# Patient Record
Sex: Male | Born: 1955 | Race: White | Hispanic: No | Marital: Single | Smoking: Never smoker
Health system: Southern US, Community
[De-identification: ages and names within clinical notes are randomized; demographics above are authoritative.]

## PROBLEM LIST (undated history)

## (undated) DIAGNOSIS — E079 Disorder of thyroid, unspecified: Secondary | ICD-10-CM

## (undated) DIAGNOSIS — K219 Gastro-esophageal reflux disease without esophagitis: Secondary | ICD-10-CM

## (undated) DIAGNOSIS — IMO0001 Reserved for inherently not codable concepts without codable children: Secondary | ICD-10-CM

## (undated) DIAGNOSIS — B009 Herpesviral infection, unspecified: Secondary | ICD-10-CM

## (undated) HISTORY — PX: RECTAL SURGERY: SHX760

---

## 2001-04-10 ENCOUNTER — Emergency Department (HOSPITAL_COMMUNITY): Admission: EM | Admit: 2001-04-10 | Discharge: 2001-04-10 | Payer: Self-pay | Admitting: Emergency Medicine

## 2001-04-10 ENCOUNTER — Encounter: Payer: Self-pay | Admitting: Emergency Medicine

## 2001-05-12 ENCOUNTER — Ambulatory Visit (HOSPITAL_COMMUNITY): Admission: RE | Admit: 2001-05-12 | Discharge: 2001-05-12 | Payer: Self-pay | Admitting: *Deleted

## 2005-05-01 ENCOUNTER — Ambulatory Visit (HOSPITAL_COMMUNITY): Admission: RE | Admit: 2005-05-01 | Discharge: 2005-05-01 | Payer: Self-pay | Admitting: *Deleted

## 2005-05-01 ENCOUNTER — Encounter (INDEPENDENT_AMBULATORY_CARE_PROVIDER_SITE_OTHER): Payer: Self-pay | Admitting: Specialist

## 2006-05-08 ENCOUNTER — Encounter: Admission: RE | Admit: 2006-05-08 | Discharge: 2006-05-08 | Payer: Self-pay | Admitting: Orthopedic Surgery

## 2008-07-04 ENCOUNTER — Encounter (INDEPENDENT_AMBULATORY_CARE_PROVIDER_SITE_OTHER): Payer: Self-pay | Admitting: *Deleted

## 2008-07-04 ENCOUNTER — Ambulatory Visit (HOSPITAL_COMMUNITY): Admission: RE | Admit: 2008-07-04 | Discharge: 2008-07-04 | Payer: Self-pay | Admitting: *Deleted

## 2008-07-14 ENCOUNTER — Encounter: Admission: RE | Admit: 2008-07-14 | Discharge: 2008-07-14 | Payer: Self-pay | Admitting: Endocrinology

## 2008-08-24 ENCOUNTER — Encounter: Admission: RE | Admit: 2008-08-24 | Discharge: 2008-08-24 | Payer: Self-pay | Admitting: Surgery

## 2009-11-01 ENCOUNTER — Encounter: Admission: RE | Admit: 2009-11-01 | Discharge: 2009-11-01 | Payer: Self-pay | Admitting: Cardiovascular Disease

## 2009-11-02 ENCOUNTER — Ambulatory Visit (HOSPITAL_COMMUNITY): Admission: RE | Admit: 2009-11-02 | Discharge: 2009-11-02 | Payer: Self-pay | Admitting: Cardiovascular Disease

## 2009-12-26 ENCOUNTER — Encounter: Admission: RE | Admit: 2009-12-26 | Discharge: 2009-12-26 | Payer: Self-pay | Admitting: Internal Medicine

## 2010-09-13 ENCOUNTER — Other Ambulatory Visit: Payer: Self-pay | Admitting: Internal Medicine

## 2010-09-13 DIAGNOSIS — E041 Nontoxic single thyroid nodule: Secondary | ICD-10-CM

## 2010-11-12 NOTE — Op Note (Signed)
NAME:  Lonnie Waters, Lonnie Waters NO.:  [id number]   MEDICAL RECORD NO.:  [id number]          PATIENT TYPE:  AMB   LOCATION:  ENDO                         FACILITY:  [facility]   PHYSICIAN:  Georgiana Spinner, M.D.    DATE OF BIRTH:  04-May-1956   DATE OF PROCEDURE:  07/04/2008  DATE OF DISCHARGE:                               OPERATIVE REPORT   PROCEDURE:  Colonoscopy.   INDICATIONS:  Rectal bleeding.   ANESTHESIA:  Fentanyl 150 mcg, Versed 15 mg.   PROCEDURE:  With the patient mildly sedated in the left lateral  decubitus position, a rectal examination was performed.  Perineal exam  showed external skin tags.  Rectal exam was unremarkable.  Subsequently,  the Pentax videoscopic colonoscope was inserted in the rectum, passed  under direct vision with pressure applied to reach the cecum identified  by ileocecal valve and appendiceal orifice, both which were  photographed.  From this point the colonoscope was slowly withdrawn  taking circumferential views of colonic mucosa stopping in the  transverse colon where a polyp was seen, photographed and removed using  snare cautery technique setting of 20/150 blended current.  Tissue was  then retrieved by suctioning through the endoscope into a tissue trap.  The endoscope was then further withdrawn taking circumferential views of  the remaining colonic mucosa stopping in the rectum which appeared  normal on direct and showed internal hemorrhoids on retroflexed view.  I  could not see a stricture at this point.  The endoscope was straightened  and withdrawn.  The patient's vital signs, pulse oximeter remained  stable.  The patient tolerated procedure well without apparent  complications.   FINDINGS:  Internal hemorrhoids with no evidence of fissure or  stricture.  External skin tags were noted.  Additionally a polyp was  noted in the transverse colon.   PLAN:  Referral to surgery for treatment of continued anal discomfort  and await  biopsy report.  The patient will call me for results and  follow up with me as an outpatient.           ______________________________  Georgiana Spinner, M.D.     GMO/MEDQ  D:  07/04/2008  T:  07/04/2008  Job:  [id number]

## 2010-11-15 NOTE — Op Note (Signed)
NAME:  Lonnie Waters, Lonnie Waters NO.:  [id number]   MEDICAL RECORD NO.:  [id number]          PATIENT TYPE:  AMB   LOCATION:  ENDO                         FACILITY:  [facility]   PHYSICIAN:  Georgiana Spinner, M.D.    DATE OF BIRTH:  [birth year]   DATE OF PROCEDURE:  DATE OF DISCHARGE:                                 OPERATIVE REPORT   PROCEDURE:  Colonoscopy.   INDICATIONS:  Rectal bleeding.   ANESTHESIA:  Demerol 25, Verse 2.5 mg.   PROCEDURE:  With the patient mildly sedated in the left lateral decubitus  position, a rectal examination was performed which was unremarkable.  Subsequently the Olympus videoscopic colonoscope was inserted in the rectum  and passed under direct vision to the cecum, identified by ileocecal valve  and base of cecum, both of which were photographed.  From this point, the  colonoscope was slowly withdrawn taking circumferential views of the colonic  mucosa, stopping to photograph diverticulosis of the sigmoid colon until we  reached the rectum which appeared normal on direct and showed hemorrhoids on  retroflexed view.  The endoscope was straightened and withdrawn through the  anal canal. The patient's vital signs, pulse oximeter remained stable. The  patient tolerated procedure well without apparent complications.   FINDINGS:  1.  Diverticulosis of sigmoid colon.  2.  Internal hemorrhoids.   PLAN:  See endoscopy note further details.           ______________________________  Georgiana Spinner, M.D.     GMO/MEDQ  D:  05/01/2005  T:  05/01/2005  Job:  [id number]

## 2010-11-15 NOTE — Op Note (Signed)
NAME:  Lonnie Waters, Lonnie Waters NO.:  [id number]   MEDICAL RECORD NO.:  [id number]          PATIENT TYPE:  AMB   LOCATION:  ENDO                         FACILITY:  [facility]   PHYSICIAN:  Georgiana Spinner, M.D.    DATE OF BIRTH:  02-17-1956   DATE OF PROCEDURE:  05/01/2005  DATE OF DISCHARGE:                                 OPERATIVE REPORT   PROCEDURE:  Upper endoscopy with biopsy.   INDICATIONS:  GERD.   ANESTHESIA:  Demerol 75, Versed 7.5 milligrams.   PROCEDURE:  With the patient mildly sedated in the left lateral decubitus  position the Olympus videoscopic endoscope was inserted in the mouth and  passed under direct vision through the esophagus which appeared normal.  There was no evidence of Barrett's esophagus seen. We entered into the  stomach.  The fundus, body, antrum, duodenal bulb, second portion duodenum  were visualized.  From this point the endoscope was slowly withdrawn taking  circumferential views of duodenal mucosa until the endoscope had been pulled  back in the stomach, placed in retroflexion to view the stomach from below.  The endoscope was then straightened and withdrawn taking circumferential  views of the remaining gastric and esophageal mucosa stopping to biopsy the  stomach in the areas of diffuse erythema.  The patient's vital signs, pulse  oximeter remained stable. The patient tolerated procedure well without  apparent complications.   FINDINGS:  Diffuse gastritis and moderately severe duodenitis.   PLAN:  Await biopsy report. The patient will call me for results and follow-  up with me as an outpatient but given the marked erythema of the stomach and  the duodenum, I think PPI therapy would certainly be reasonable at the least  and/or treatment for H. Pylori if positive.  Proceed to colonoscopy as  planned.           ______________________________  Georgiana Spinner, M.D.     GMO/MEDQ  D:  05/01/2005  T:  05/01/2005  Job:  [id number]

## 2010-11-15 NOTE — Procedures (Signed)
[facility]  Patient:    Lonnie Waters, Lonnie Waters Visit Number: [id number] MRN: [id number]          Service Type: END Location: ENDO Attending Physician:  Sabino Gasser Dictated by:   Sabino Gasser, M.D. Admit Date:  05/12/2001                             Procedure Report  PROCEDURE:  Colonoscopy.  ENDOSCOPIST:  Sabino Gasser, M.D.  INDICATIONS:  Colon cancer screening, change in bowel habits, heme-positive.  ANESTHESIA:  Demerol 60, Versed 8 mg.  DESCRIPTION OF PROCEDURE:  With the patient mildly sedated in the left lateral decubitus position, rectal examination was performed which revealed trace positive material.  Subsequently the Olympus videoscopic colonoscope was inserted in the rectum and passed under direction vision into the cecum, identified by ileocecal valve and appendiceal orifice, both of which were photographed from this point.  The colonoscope was slowly withdrawn, taking circumferential views of the entire colonic mucosa, stopping in the rectum which appeared normal on direct and retroflexed view and also with normal anal canal pull through.  The endoscope was withdrawn.  The patients vital signs and pulse oximetry remained stable.  The patient tolerated procedure well without apparent complications.  FINDINGS:  Essentially negative colonoscopic examination.  PLAN:  Followup with me as needed. Dictated by:   Sabino Gasser, M.D. Attending Physician:  Sabino Gasser DD:  05/12/01 TD:  05/12/01 Job: [id number]

## 2011-02-03 ENCOUNTER — Ambulatory Visit
Admission: RE | Admit: 2011-02-03 | Discharge: 2011-02-03 | Disposition: A | Discharge status: Home / Self Care | Payer: BC Managed Care – PPO | Source: Ambulatory Visit | Attending: Internal Medicine | Admitting: Internal Medicine

## 2011-02-03 DIAGNOSIS — E041 Nontoxic single thyroid nodule: Secondary | ICD-10-CM

## 2011-05-10 ENCOUNTER — Emergency Department (HOSPITAL_BASED_OUTPATIENT_CLINIC_OR_DEPARTMENT_OTHER)
Admission: EM | Admit: 2011-05-10 | Discharge: 2011-05-11 | Disposition: A | Discharge status: Home / Self Care | Payer: BC Managed Care – PPO | Attending: Emergency Medicine | Admitting: Emergency Medicine

## 2011-05-10 ENCOUNTER — Encounter: Payer: Self-pay | Admitting: *Deleted

## 2011-05-10 DIAGNOSIS — R109 Unspecified abdominal pain: Secondary | ICD-10-CM | POA: Insufficient documentation

## 2011-05-10 DIAGNOSIS — E079 Disorder of thyroid, unspecified: Secondary | ICD-10-CM | POA: Insufficient documentation

## 2011-05-10 DIAGNOSIS — K219 Gastro-esophageal reflux disease without esophagitis: Secondary | ICD-10-CM | POA: Insufficient documentation

## 2011-05-10 DIAGNOSIS — Z79899 Other long term (current) drug therapy: Secondary | ICD-10-CM | POA: Insufficient documentation

## 2011-05-10 HISTORY — DX: Gastro-esophageal reflux disease without esophagitis: K21.9

## 2011-05-10 HISTORY — DX: Reserved for inherently not codable concepts without codable children: IMO0001

## 2011-05-10 HISTORY — DX: Disorder of thyroid, unspecified: E07.9

## 2011-05-10 HISTORY — DX: Herpesviral infection, unspecified: B00.9

## 2011-05-10 LAB — URINALYSIS, ROUTINE W REFLEX MICROSCOPIC
Ketones, ur: 40 mg/dL — AB
Leukocytes, UA: NEGATIVE
Nitrite: NEGATIVE
Protein, ur: NEGATIVE mg/dL
Urobilinogen, UA: 1 mg/dL (ref 0.0–1.0)

## 2011-05-10 NOTE — ED Provider Notes (Signed)
Scribed for Lonnie Seamen, MD, the patient was seen in room MH10/MH10 . This chart was scribed by Lonnie Waters.   CSN: [id number] Arrival date & time: 05/10/2011 10:56 PM   First MD Initiated Contact with Patient 05/10/11 2324      Chief Complaint  Patient presents with  . Abdominal Pain    (Consider location/radiation/quality/duration/timing/severity/associated sxs/prior treatment) Patient is a 55 y.o. male presenting with abdominal pain. The history is provided by the patient. No language interpreter was used.  Abdominal Pain The primary symptoms of the illness include abdominal pain and fever. The primary symptoms of the illness do not include nausea, vomiting or diarrhea. The current episode started more than 2 days ago. The onset of the illness was sudden. The problem has not changed since onset. The abdominal pain is located in the epigastric region. The abdominal pain does not radiate. The severity of the abdominal pain is 10/10. The abdominal pain is relieved by movement. The abdominal pain is exacerbated by eating.  Additional symptoms associated with the illness include constipation.   Pt seen at 11:25 PM Lonnie Waters is a 55 y.o. male who presents to the Emergency Department complaining of intermittent epigastric abdominal pain for 3 days. Episodes of pain last for hours. Pain during each episode is described as constant and severe. Pt c/o associated severe constipation. Denies n/v/d. Pt treated pain today with prescription pain (left over from previous surgery) with improvement  Pt saw PCP Dr. Norma Waters yesterday. Pt had blood work done and told he either had ulcer or a rxn to new herpes medication. Pt dx with genital herpes this week and was put on acyclovir. Pt has active outbreak and reports low-grade fever all week. Pt quit taking acyclovir yesterday .     Past Medical History  Diagnosis Date  . Reflux   . Herpes   . Thyroid disease     Past Surgical History  Procedure  Date  . Rectal surgery     History reviewed. No pertinent family history.  History  Substance Use Topics  . Smoking status: Never Smoker   . Smokeless tobacco: Never Used  . Alcohol Use: Yes     4x weekly     Review of Systems  Constitutional: Positive for fever.  Gastrointestinal: Positive for abdominal pain and constipation. Negative for nausea, vomiting and diarrhea.  All other systems reviewed and are negative.   Allergies  Review of patient's allergies indicates no known allergies.  Home Medications   Current Outpatient Rx  Name Route Sig Dispense Refill  . ACYCLOVIR 400 MG PO TABS Oral Take 400 mg by mouth 3 (three) times daily.      . ARMODAFINIL 150 MG PO TABS Oral Take 150 mg by mouth 3 (three) times a week. Take on Tuesday, Friday and Sunday     . B COMPLEX PO TABS Oral Take 1 tablet by mouth daily.      Marland Kitchen ESOMEPRAZOLE MAGNESIUM 40 MG PO CPDR Oral Take 40 mg by mouth 2 (two) times daily.     . OMEGA-3 FATTY ACIDS 1000 MG PO CAPS Oral Take 1 g by mouth daily.      Marland Kitchen HYDROCODONE-ACETAMINOPHEN 10-325 MG PO TABS Oral Take 2 tablets by mouth once.      Marland Kitchen LEVOTHYROXINE SODIUM 100 MCG PO TABS Oral Take 100 mcg by mouth daily.      Marland Kitchen ONE-DAILY MULTI VITAMINS PO TABS Oral Take 1 tablet by mouth daily.      Marland Kitchen  VITAMIN C 500 MG PO TABS Oral Take 500 mg by mouth daily.      Marland Kitchen ZOLPIDEM TARTRATE 10 MG PO TABS Oral Take 5-10 mg by mouth at bedtime as needed. For sleep        BP 140/80  Pulse 62  Temp(Src) 98.1 F (36.7 C) (Oral)  Resp 18  Ht 5\' 9"  (1.753 m)  Wt 214 lb (97.07 kg)  BMI 31.60 kg/m2  SpO2 98%  Physical Exam  Nursing note and vitals reviewed. Constitutional: He is oriented to person, place, and time. He appears well-developed and well-nourished.  HENT:  Head: Normocephalic and atraumatic.  Eyes: Conjunctivae and EOM are normal.  Neck: Normal range of motion.  Cardiovascular: Normal rate, regular rhythm, normal heart sounds and intact distal pulses.     Pulmonary/Chest: Effort normal and breath sounds normal. No respiratory distress.  Abdominal: Soft. He exhibits no distension. There is no tenderness.       No gallstones seen on bedside US  Musculoskeletal: Normal range of motion.  Neurological: He is alert and oriented to person, place, and time.  Skin: Skin is warm and dry.  Psychiatric: He has a normal mood and affect.    ED Course  Procedures (including critical care time) OTHER DATA REVIEWED: Nursing notes, vital signs, and past medical records reviewed.   DIAGNOSTIC STUDIES: Oxygen Saturation is 98% on room air, normal by my interpretation.     11:32 PM EDP at PT bedside. Bedside US.  No gallstones noted.      MDM  1:06 AM Patient now complaining of pain and point tenderness in his right lower posterior rib region. There is point tenderness on exam. He is having difficulty distinguishing this from the abdominal pain he has been having.  I personally performed the services described in this documentation, which was scribed in my presence.  The recorded information has been reviewed and considered.  Nursing notes and vitals signs, including pulse oximetry, reviewed.  Summary of this visit's results, reviewed by myself:  Labs:  Results for orders placed during the hospital encounter of 05/10/11  URINALYSIS, ROUTINE W REFLEX MICROSCOPIC      Component Value Range   Color, Urine AMBER (*) YELLOW    Appearance CLEAR  CLEAR    Specific Gravity, Urine 1.028  1.005 - 1.030    pH 5.5  5.0 - 8.0    Glucose, UA NEGATIVE  NEGATIVE (mg/dL)   Hgb urine dipstick NEGATIVE  NEGATIVE    Bilirubin Urine NEGATIVE  NEGATIVE    Ketones, ur 40 (*) NEGATIVE (mg/dL)   Protein, ur NEGATIVE  NEGATIVE (mg/dL)   Urobilinogen, UA 1.0  0.0 - 1.0 (mg/dL)   Nitrite NEGATIVE  NEGATIVE    Leukocytes, UA NEGATIVE  NEGATIVE   CBC      Component Value Range   WBC 6.3  4.0 - 10.5 (K/uL)   RBC 4.65  4.22 - 5.81 (MIL/uL)   Hemoglobin 14.9  13.0 -  17.0 (g/dL)   HCT 04.5  40.9 - 81.1 (%)   MCV 90.5  78.0 - 100.0 (fL)   MCH 32.0  26.0 - 34.0 (pg)   MCHC 35.4  30.0 - 36.0 (g/dL)   RDW 91.4  78.2 - 95.6 (%)   Platelets 141 (*) 150 - 400 (K/uL)  DIFFERENTIAL      Component Value Range   Neutrophils Relative PENDING  43 - 77 (%)   Neutro Abs PENDING  1.7 - 7.7 (K/uL)   Band  Neutrophils PENDING  0 - 10 (%)   Lymphocytes Relative PENDING  12 - 46 (%)   Lymphs Abs PENDING  0.7 - 4.0 (K/uL)   Monocytes Relative PENDING  3 - 12 (%)   Monocytes Absolute PENDING  0.1 - 1.0 (K/uL)   Eosinophils Relative PENDING  0 - 5 (%)   Eosinophils Absolute PENDING  0.0 - 0.7 (K/uL)   Basophils Relative PENDING  0 - 1 (%)   Basophils Absolute PENDING  0.0 - 0.1 (K/uL)   WBC Morphology PENDING     RBC Morphology PENDING     Smear Review PENDING     nRBC PENDING  0 (/100 WBC)   Metamyelocytes Relative PENDING     Myelocytes PENDING     Promyelocytes Absolute PENDING     Blasts PENDING    COMPREHENSIVE METABOLIC PANEL      Component Value Range   Sodium 138  135 - 145 (mEq/L)   Potassium 3.4 (*) 3.5 - 5.1 (mEq/L)   Chloride 99  96 - 112 (mEq/L)   CO2 29  19 - 32 (mEq/L)   Glucose, Bld 107 (*) 70 - 99 (mg/dL)   BUN 12  6 - 23 (mg/dL)   Creatinine, Ser 4.09  0.50 - 1.35 (mg/dL)   Calcium 9.6  8.4 - 81.1 (mg/dL)   Total Protein 7.1  6.0 - 8.3 (g/dL)   Albumin 3.5  3.5 - 5.2 (g/dL)   AST 50 (*) 0 - 37 (U/L)   ALT 54 (*) 0 - 53 (U/L)   Alkaline Phosphatase 71  39 - 117 (U/L)   Total Bilirubin 0.6  0.3 - 1.2 (mg/dL)   GFR calc non Af Amer >90  >90 (mL/min)   GFR calc Af Amer >90  >90 (mL/min)  LIPASE, BLOOD      Component Value Range   Lipase 25  11 - 59 (U/L)  URINALYSIS, ROUTINE W REFLEX MICROSCOPIC      Component Value Range   Color, Urine AMBER (*) YELLOW    Appearance CLOUDY (*) CLEAR    Specific Gravity, Urine 1.037 (*) 1.005 - 1.030    pH 6.0  5.0 - 8.0    Glucose, UA NEGATIVE  NEGATIVE (mg/dL)   Hgb urine dipstick NEGATIVE   NEGATIVE    Bilirubin Urine NEGATIVE  NEGATIVE    Ketones, ur 40 (*) NEGATIVE (mg/dL)   Protein, ur NEGATIVE  NEGATIVE (mg/dL)   Urobilinogen, UA 0.2  0.0 - 1.0 (mg/dL)   Nitrite NEGATIVE  NEGATIVE    Leukocytes, UA NEGATIVE  NEGATIVE    Ct Abdomen Pelvis W Contrast  05/11/2011  *RADIOLOGY REPORT*  Clinical Data: Epigastric abdominal pain and fever; constipation.  CT ABDOMEN AND PELVIS WITH CONTRAST  Technique:  Multidetector CT imaging of the abdomen and pelvis was performed following the standard protocol during bolus administration of intravenous contrast.  Contrast: OMNIPAQUE IOHEXOL 300 MG/ML IV SOLN  Comparison: Pelvic radiograph performed 06/13/2010, and MRI of the right hip performed 05/08/2006  Findings: Minimal bibasilar atelectasis is noted.  The spleen is mildly enlarged, measuring 13.6 cm in length.  The liver is unremarkable in appearance.  The gallbladder is normal in appearance.  The pancreas and adrenal glands are unremarkable.  There is a 3.7 cm cyst at the lower pole of the right kidney.  Mild deformity involving the upper pole of the left kidney reflects the adjacent spleen.  Nonspecific perinephric stranding is noted bilaterally.  The kidneys are otherwise unremarkable in  appearance. There is no evidence of hydronephrosis.  No renal or ureteral stones are seen.  No free fluid is identified.  The small bowel is unremarkable in appearance.  The stomach is filled with contrast and is within normal limits.  No acute vascular abnormalities are seen.  The appendix is normal in caliber, without evidence for appendicitis.  The colon is partially filled with stool and contrast.  Diverticulosis is noted along the distal descending and sigmoid colon, with slight prominence of vasculature along the proximal sigmoid colon, but no definite evidence of diverticulitis.  The bladder is decompressed and not well assessed.  The prostate contains scattered calcification and remains normal in size.   No inguinal lymphadenopathy is seen.  No acute osseous abnormalities are identified.  Vacuum phenomenon is noted at L5-S1.  IMPRESSION:  1.  No acute abnormalities identified within the abdomen or pelvis. 2.  Diverticulosis along the distal descending and sigmoid colon, with slight prominence of vasculature along the proximal sigmoid colon, but no definite evidence of diverticulitis. 3.  Mild splenomegaly. 4.  Right renal cyst noted. 5.  Minimal degenerative change at the lower lumbar spine.  Original Report Authenticated By: Tonia Ghent, M.D.   4:39 AM Patient advised of lab and CT findings. Pattern of pain suggests gallbladder spasm. Although no gallstones were seen on bedside ultrasound the patient was advised that this was not a formal study and biliary colic has not been definitively ruled out. Alternatively the patient could have peptic ulcer disease as the symptoms worsened with meals; the patient has been on Nexium for several years. He will followup with Dr. Renne Crigler next week.       Lonnie Seamen, MD 05/11/11 [phone]

## 2011-05-10 NOTE — ED Notes (Signed)
Pt states he has had severe upper abd pain since last Wed. Saw Dr. Norma Fredrickson And had tests run yesterday. ?dx'd with an ulcer vs. Reaction to new med.

## 2011-05-11 ENCOUNTER — Emergency Department (HOSPITAL_BASED_OUTPATIENT_CLINIC_OR_DEPARTMENT_OTHER): Admit: 2011-05-11 | Payer: BC Managed Care – PPO

## 2011-05-11 ENCOUNTER — Emergency Department (INDEPENDENT_AMBULATORY_CARE_PROVIDER_SITE_OTHER): Payer: BC Managed Care – PPO

## 2011-05-11 ENCOUNTER — Emergency Department (HOSPITAL_COMMUNITY): Payer: BC Managed Care – PPO

## 2011-05-11 DIAGNOSIS — R1013 Epigastric pain: Secondary | ICD-10-CM

## 2011-05-11 DIAGNOSIS — K5730 Diverticulosis of large intestine without perforation or abscess without bleeding: Secondary | ICD-10-CM

## 2011-05-11 DIAGNOSIS — K5900 Constipation, unspecified: Secondary | ICD-10-CM

## 2011-05-11 DIAGNOSIS — Q619 Cystic kidney disease, unspecified: Secondary | ICD-10-CM

## 2011-05-11 LAB — COMPREHENSIVE METABOLIC PANEL
ALT: 54 U/L — ABNORMAL HIGH (ref 0–53)
AST: 50 U/L — ABNORMAL HIGH (ref 0–37)
CO2: 29 mEq/L (ref 19–32)
Calcium: 9.6 mg/dL (ref 8.4–10.5)
Chloride: 99 mEq/L (ref 96–112)
Creatinine, Ser: 0.7 mg/dL (ref 0.50–1.35)
GFR calc Af Amer: >90 mL/min (ref >90)
GFR calc non Af Amer: >90 mL/min (ref >90)
Glucose, Bld: 107 mg/dL — ABNORMAL HIGH (ref 70–99)
Sodium: 138 mEq/L (ref 135–145)
Total Bilirubin: 0.6 mg/dL (ref 0.3–1.2)

## 2011-05-11 LAB — CBC
HCT: 42.5 % (ref 39.0–52.0)
MCH: 32 pg (ref 26.0–34.0)
MCH: 32.3 pg (ref 26.0–34.0)
MCHC: 35.4 g/dL (ref 30.0–36.0)
MCHC: 35.1 g/dL (ref 30.0–36.0)
MCV: 92.2 fL (ref 78.0–100.0)
Platelets: 149 10*3/uL — ABNORMAL LOW (ref 150–400)
RDW: 12.4 % (ref 11.5–15.5)
RDW: 12.4 % (ref 11.5–15.5)
WBC: 6.3 10*3/uL (ref 4.0–10.5)

## 2011-05-11 LAB — DIFFERENTIAL
Basophils Absolute: 0.1 10*3/uL (ref 0.0–0.1)
Basophils Relative: 0 % (ref 0–1)
Eosinophils Absolute: 0.2 10*3/uL (ref 0.0–0.7)
Eosinophils Absolute: 0 10*3/uL (ref 0.0–0.7)
Eosinophils Relative: 3 % (ref 0–5)
Lymphs Abs: 2.5 10*3/uL (ref 0.7–4.0)
Monocytes Absolute: 0.6 10*3/uL (ref 0.1–1.0)
Monocytes Absolute: 0.7 10*3/uL (ref 0.1–1.0)
Monocytes Relative: 11 % (ref 3–12)
Neutro Abs: 2.9 10*3/uL (ref 1.7–7.7)
Neutro Abs: 3 10*3/uL (ref 1.7–7.7)

## 2011-05-11 LAB — URINALYSIS, ROUTINE W REFLEX MICROSCOPIC
Glucose, UA: NEGATIVE mg/dL
Hgb urine dipstick: NEGATIVE
Ketones, ur: 40 mg/dL — AB
Protein, ur: NEGATIVE mg/dL
Urobilinogen, UA: 0.2 mg/dL (ref 0.0–1.0)

## 2011-05-11 MED ORDER — IOHEXOL 300 MG/ML  SOLN
100.0000 mL | Freq: Once | INTRAMUSCULAR | Status: AC | PRN
  Administered 2011-05-11: 100 mL via INTRAVENOUS

## 2011-05-11 MED ORDER — SODIUM CHLORIDE 0.9 % IV SOLN
Freq: Once | INTRAVENOUS | Status: AC
  Administered 2011-05-11: 02:00:00 via INTRAVENOUS

## 2011-05-11 MED ORDER — ONDANSETRON HCL 4 MG/2ML IJ SOLN
4.0000 mg | Freq: Once | INTRAMUSCULAR | Status: AC
  Administered 2011-05-11: 4 mg via INTRAVENOUS
  Filled 2011-05-11: qty 2

## 2011-05-11 MED ORDER — HYDROMORPHONE HCL PF 1 MG/ML IJ SOLN
1.0000 mg | Freq: Once | INTRAMUSCULAR | Status: AC
  Administered 2011-05-11: 1 mg via INTRAVENOUS
  Filled 2011-05-11: qty 1

## 2011-05-11 MED ORDER — HYDROMORPHONE HCL 4 MG PO TABS: 4.0000 mg | ORAL_TABLET | ORAL | Status: AC | PRN

## 2011-05-11 MED ORDER — ONDANSETRON HCL 8 MG PO TABS: 8.0000 mg | ORAL_TABLET | Freq: Three times a day (TID) | ORAL | Status: AC | PRN

## 2011-05-11 NOTE — ED Notes (Signed)
Pt c/o mid back pain over right ribs- states "it may be this bed"- slightly tachypneic- EDP [place] at bedside to evaluate

## 2011-05-11 NOTE — ED Notes (Signed)
Returned from radiology. 

## 2011-05-12 ENCOUNTER — Other Ambulatory Visit (HOSPITAL_COMMUNITY): Payer: Self-pay | Admitting: Gastroenterology

## 2011-05-27 ENCOUNTER — Encounter (HOSPITAL_COMMUNITY)
Admission: RE | Admit: 2011-05-27 | Discharge: 2011-05-27 | Disposition: A | Discharge status: Home / Self Care | Payer: BC Managed Care – PPO | Source: Ambulatory Visit | Attending: Gastroenterology | Admitting: Gastroenterology

## 2011-05-27 DIAGNOSIS — R109 Unspecified abdominal pain: Secondary | ICD-10-CM | POA: Insufficient documentation

## 2011-05-27 MED ORDER — TECHNETIUM TC 99M MEBROFENIN IV KIT
5.0000 | PACK | Freq: Once | INTRAVENOUS | Status: AC | PRN
  Administered 2011-05-27: 5 via INTRAVENOUS

## 2011-05-27 MED ORDER — SINCALIDE 5 MCG IJ SOLR
INTRAMUSCULAR | Status: AC
  Administered 2011-05-27: 1.89 ug via INTRAVENOUS
  Filled 2011-05-27: qty 5

## 2013-02-14 ENCOUNTER — Ambulatory Visit (INDEPENDENT_AMBULATORY_CARE_PROVIDER_SITE_OTHER): Payer: BC Managed Care – PPO | Admitting: Sports Medicine

## 2013-02-14 VITALS — BP 133/85 | Ht 69.0 in | Wt 212.0 lb

## 2013-02-14 DIAGNOSIS — M25551 Pain in right hip: Secondary | ICD-10-CM

## 2013-02-14 DIAGNOSIS — M25559 Pain in unspecified hip: Secondary | ICD-10-CM

## 2013-02-14 DIAGNOSIS — M545 Low back pain, unspecified: Secondary | ICD-10-CM

## 2013-02-14 DIAGNOSIS — M2170 Unequal limb length (acquired), unspecified site: Secondary | ICD-10-CM

## 2013-02-14 NOTE — Assessment & Plan Note (Signed)
Pt with functional LLD of 2 cm difference. 3/16 in insert applied to Hapad insoles on the R side and consider increasing at next visit if pt notices a difference in his hip/low back Sxs.  Will follow up after results from his hip and back films are completed.

## 2013-02-14 NOTE — Patient Instructions (Addendum)
Hip Pain  The hips join the upper legs to the lower pelvis. The bones, cartilage, tendons, and muscles of the hip joint perform a lot of work each day holding your body weight and allowing you to move around.  Hip pain is a common symptom. It can range from a minor ache to severe pain on 1 or both hips. Pain may be felt on the inside of the hip joint near the groin, or the outside near the buttocks and upper thigh. There may be swelling or stiffness as well. It occurs more often when a person walks or performs activity. There are many reasons hip pain can develop.  CAUSES   It is important to work with your caregiver to identify the cause since many conditions can impact the bones, cartilage, muscles, and tendons of the hips. Causes for hip pain include:  · Broken (fractured) bones.  · Separation of the thighbone from the hip socket (dislocation).  · Torn cartilage of the hip joint.  · Swelling (inflammation) of a tendon (tendonitis), the sac within the hip joint (bursitis), or a joint.  · A weakening in the abdominal wall (hernia), affecting the nerves to the hip.  · Arthritis in the hip joint or lining of the hip joint.  · Pinched nerves in the back, hip, or upper thigh.  · A bulging disc in the spine (herniated disc).  · Rarely, bone infection or cancer.  DIAGNOSIS   The location of your hip pain will help your caregiver understand what may be causing the pain. A diagnosis is based on your medical history, your symptoms, results from your physical exam, and results from diagnostic tests. Diagnostic tests may include X-ray exams, a computerized magnetic scan (magnetic resonance imaging, MRI), or bone scan.  TREATMENT   Treatment will depend on the cause of your hip pain. Treatment may include:  · Limiting activities and resting until symptoms improve.  · Crutches or other walking supports (a cane or brace).  · Ice, elevation, and compression.  · Physical therapy or home exercises.  · Shoe inserts or special  shoes.  · Losing weight.  · Medications to reduce pain.  · Undergoing surgery.  HOME CARE INSTRUCTIONS   · Only take over-the-counter or prescription medicines for pain, discomfort, or fever as directed by your caregiver.  · Put ice on the injured area:  · Put ice in a plastic bag.  · Place a towel between your skin and the bag.  · Leave the ice on for 15-20 minutes at a time, 3-4 times a day.  · Keep your leg raised (elevated) when possible to lessen swelling.  · Avoid activities that cause pain.  · Follow specific exercises as directed by your caregiver.  · Sleep with a pillow between your legs on your most comfortable side.  · Record how often you have hip pain, the location of the pain, and what it feels like. This information may be helpful to you and your caregiver.  · Ask your caregiver about returning to work or sports and whether you should drive.  · Follow up with your caregiver for further exams, therapy, or testing as directed.  SEEK MEDICAL CARE IF:   · Your pain or swelling continues or worsens after 1 week.  · You are feeling unwell or have chills.  · You have increasing difficulty with walking.  · You have a loss of sensation or other new symptoms.  · You have questions or concerns.  SEEK   IMMEDIATE MEDICAL CARE IF:   · You cannot put weight on the affected hip.  · You have fallen.  · You have a sudden increase in pain and swelling in your hip.  · You have a fever.  MAKE SURE YOU:   · Understand these instructions.  · Will watch your condition.  · Will get help right away if you are not doing well or get worse.  Document Released: 12/04/2009 Document Revised: 09/08/2011 Document Reviewed: 12/04/2009  [employer]® Patient Information ©2014 [employer].

## 2013-02-14 NOTE — Assessment & Plan Note (Addendum)
Pt describing more of a low back tightness, more so than low back pain.  Exam negative for straight leg raise/seated leg raise and history negative for red flags including weight loss, midline tenderness, bowel/bladder incontinence.  Most likely combination of tight hamstrings and ligamentous stretching/straining of the lumbar region (possibly iliolumbar ligament B/L).  Will get A/P and lateral lumbar films to evaluate for joint space narrowing, decreased disc space, spondylosis/spondylolysis, osteolytic/blastic changes.  After films are completed, will consider further plan and follow up.

## 2013-02-14 NOTE — Progress Notes (Signed)
Lonnie Waters is a 57 y.o. referred by Dr. Jamie Kato who presents today for B/L hip pain and low back tightness.  Pt states that he has had R hip pain for about 3 yrs and L hip pain now for about 18 months as well as low back tightness.  Pt's R hip pain began around 3 yrs ago, no inciting event or trauma, and was located on the lateral aspect of his hip.  He saw his PCP for this and had films performed at that time, and was dx with trochanteric bursitis.  He was originally taking advil PRN for this pain, and progressed to starting to take Ultram around 1-2 yrs ago.  His PCP did try him on Celebrex at that time w/ minimal to no relief.  As well, he developed L hip pain around 18 months ago, again no inciting event or trauma.  Pain described as sharp, intermittent, worse after falling asleep and waking up.  However, pain will dissipate after moving around and is alleviated by Ultram use.  Pt uses 1-2 tabs Ultram 50 mg PRN and on a good day 0, but at its worst pain 2 tablets.  He denies any weakness in his legs, paresthesias down his legs, bowel or bladder incontinence, fever, chills, weight loss, bony pain.    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  He lives at home with a significant other, denies smoking history, has 4-8 beers about 3-4 x per week, sometimes more, and admits to distant illicit drug use including cocaine in the 80's but no IVDA or recent drug use within the past 10 yrs.    Does have FMHx of either OA/RA in maternal GF and his mother.  Otherwise noncontributory.   Past Medical History  Diagnosis Date  . Reflux   . Herpes   . Thyroid disease     History   Social History  . Marital Status: Married    Spouse Name: N/A    Number of Children: N/A  . Years of Education: N/A   Occupational History  . Not on file.   Social History Main Topics  . Smoking status: Never Smoker   . Smokeless  tobacco: Never Used  . Alcohol Use: Yes     Comment: 4x weekly  . Drug Use: No  . Sexual Activity:    Other Topics Concern  . Not on file   Social History Narrative  . No narrative on file    No family history on file.  Current Outpatient Prescriptions on File Prior to Visit  Medication Sig Dispense Refill  . acyclovir (ZOVIRAX) 400 MG tablet Take 400 mg by mouth 3 (three) times daily.        . Armodafinil (NUVIGIL) 150 MG tablet Take 150 mg by mouth 3 (three) times a week. Take on Tuesday, Friday and Sunday       . b complex vitamins tablet Take 1 tablet by mouth daily.        Marland Kitchen esomeprazole (NEXIUM) 40 MG capsule Take 40 mg by mouth 2 (two) times daily.       . fish oil-omega-3 fatty acids 1000 MG capsule Take 1 g by mouth daily.        Marland Kitchen levothyroxine (SYNTHROID, LEVOTHROID) 100 MCG tablet Take 100 mcg by mouth daily.        . Multiple Vitamin (MULTIVITAMIN) tablet Take 1 tablet by mouth daily.        Marland Kitchen  vitamin C (ASCORBIC ACID) 500 MG tablet Take 500 mg by mouth daily.        Marland Kitchen zolpidem (AMBIEN) 10 MG tablet Take 5-10 mg by mouth at bedtime as needed. For sleep         No current facility-administered medications on file prior to visit.     Review of Symptoms  General:  Negative for nexplained weight loss, fever Skin: Negative for new or changing mole, sore that won't heal HEENT: Negative for trouble hearing, trouble seeing, ringing in ears, mouth sores, hoarseness, change in voice, dysphagia. CV:  Negative for chest pain, dyspnea, edema, palpitations Resp: Negative for cough, dyspnea, hemoptysis GI: Negative for nausea, vomiting, diarrhea, constipation, abdominal pain, melena, hematochezia. GU: Negative for dysuria, incontinence, urinary hesitance, hematuria, vaginal or penile discharge, polyuria, sexual difficulty, lumps in testicle or breasts MSK: Negative for muscle cramps or aches Neuro: Negative for headaches, weakness, numbness, dizziness, passing  out/fainting Psych: Negative for depression, memory problems   Physical Exam Filed Vitals:   02/14/13 1042  BP: 133/85    Gen: NAD, Well nourished, Well developed HEENT: PERLA, EOMI, [place]/AT Neck: no JVD Cardio: RRR, no murmurs appreciated  Lungs: CTA, no wheezes, rhonchi, crackles Abd: NABS, soft nontender nondistended Neuro: MS 5/5 B/L UE and LE, +2 patellar and achilles relfex b/l, sensation intact throughout UE and LE Psych: AAO x 3 Vascular Exam : DP and PT +2 B/L   MSK:  Back Exam: Inspection - No gross deformities, no lumbar or thoracic curve, no edema noted Gait - Able to walk on heels and toes, no antalgic gait ROM - 90 degrees lumbar flexion, 20-25 degrees lumbar extension, 20-30 degrees SB B/L, 45-60 degrees rotation B/L  No TTP along Lumbar Vertebrae or paraspinal muscular TTP  Pain with :   1) Lumbar Extension - no   2) Lumbar Flexion - no Straight Leg Raise - negative B/L Sitting Leg Raise - negative B/L   Hip: Leg Length R 90 cm, L 92 cm C sign + on L No gross deformities or edema B/L, no previous incision marks No TTP lateral or anterior hip B/L  ROM - Flexion/Extension/IR/ER/AB/Adduction nml w/o pain  Ober test neg B/L

## 2013-02-14 NOTE — Assessment & Plan Note (Signed)
Pt c/o L hip pain > R hip pain that has been ongoing for about 2-3 yrs.  Physical exam benign except for possible C sign on left side.  Concern for acetabular impingement vs OA, so will get B/L hip X-rays to evaluate.  Will f/u with patient after results to determine further course of plan.  For now, continue taking Ultram PRN for pain and will consider other options pending results.

## 2013-02-15 ENCOUNTER — Other Ambulatory Visit: Payer: Self-pay | Admitting: Sports Medicine

## 2013-02-15 ENCOUNTER — Telehealth: Payer: Self-pay | Admitting: Sports Medicine

## 2013-02-15 MED ORDER — MELOXICAM 15 MG PO TABS: 15.0000 mg | ORAL_TABLET | ORAL | Status: DC | PRN

## 2013-02-15 NOTE — Telephone Encounter (Signed)
I spoke with the patient on the phone after reviewing x-rays of his lumbar spine and bilateral hips. X-rays were from an outside source. No significant degenerative changes in the lumbar spine. There are mild degenerative changes in his hips bilaterally. I recommended that we try some Mobic 15 mg daily when necessary. He can use this in addition to his when necessary Ultram. Followup with me in 3 weeks. I will review his x-rays with him at that visit.

## 2013-03-08 ENCOUNTER — Ambulatory Visit: Payer: BC Managed Care – PPO | Admitting: Sports Medicine

## 2015-10-09 DIAGNOSIS — R05 Cough: Secondary | ICD-10-CM | POA: Diagnosis not present

## 2015-10-18 DIAGNOSIS — M545 Low back pain: Secondary | ICD-10-CM | POA: Diagnosis not present

## 2015-10-18 DIAGNOSIS — G4726 Circadian rhythm sleep disorder, shift work type: Secondary | ICD-10-CM | POA: Diagnosis not present

## 2015-10-22 DIAGNOSIS — M109 Gout, unspecified: Secondary | ICD-10-CM | POA: Diagnosis not present

## 2015-11-13 DIAGNOSIS — M109 Gout, unspecified: Secondary | ICD-10-CM | POA: Diagnosis not present

## 2015-12-10 DIAGNOSIS — B079 Viral wart, unspecified: Secondary | ICD-10-CM | POA: Diagnosis not present

## 2016-01-08 DIAGNOSIS — B078 Other viral warts: Secondary | ICD-10-CM | POA: Diagnosis not present

## 2016-01-08 DIAGNOSIS — D225 Melanocytic nevi of trunk: Secondary | ICD-10-CM | POA: Diagnosis not present

## 2016-01-17 DIAGNOSIS — R5383 Other fatigue: Secondary | ICD-10-CM | POA: Diagnosis not present

## 2016-03-24 DIAGNOSIS — Z23 Encounter for immunization: Secondary | ICD-10-CM | POA: Diagnosis not present

## 2016-04-01 DIAGNOSIS — Z0000 Encounter for general adult medical examination without abnormal findings: Secondary | ICD-10-CM | POA: Diagnosis not present

## 2016-04-01 DIAGNOSIS — E039 Hypothyroidism, unspecified: Secondary | ICD-10-CM | POA: Diagnosis not present

## 2016-04-01 DIAGNOSIS — Z125 Encounter for screening for malignant neoplasm of prostate: Secondary | ICD-10-CM | POA: Diagnosis not present

## 2016-04-10 DIAGNOSIS — B078 Other viral warts: Secondary | ICD-10-CM | POA: Diagnosis not present

## 2016-04-10 DIAGNOSIS — Z0000 Encounter for general adult medical examination without abnormal findings: Secondary | ICD-10-CM | POA: Diagnosis not present

## 2016-04-15 DIAGNOSIS — Z1212 Encounter for screening for malignant neoplasm of rectum: Secondary | ICD-10-CM | POA: Diagnosis not present

## 2016-06-04 DIAGNOSIS — M7522 Bicipital tendinitis, left shoulder: Secondary | ICD-10-CM | POA: Diagnosis not present

## 2016-06-05 ENCOUNTER — Other Ambulatory Visit: Payer: Self-pay | Admitting: Orthopedic Surgery

## 2016-06-05 DIAGNOSIS — S46212A Strain of muscle, fascia and tendon of other parts of biceps, left arm, initial encounter: Secondary | ICD-10-CM

## 2016-06-05 DIAGNOSIS — M7522 Bicipital tendinitis, left shoulder: Secondary | ICD-10-CM

## 2016-07-09 ENCOUNTER — Ambulatory Visit
Admission: RE | Admit: 2016-07-09 | Discharge: 2016-07-09 | Disposition: A | Discharge status: Home / Self Care | Payer: BLUE CROSS/BLUE SHIELD | Source: Ambulatory Visit | Attending: Orthopedic Surgery | Admitting: Orthopedic Surgery

## 2016-07-09 DIAGNOSIS — M7522 Bicipital tendinitis, left shoulder: Secondary | ICD-10-CM

## 2016-07-09 DIAGNOSIS — S46212A Strain of muscle, fascia and tendon of other parts of biceps, left arm, initial encounter: Secondary | ICD-10-CM

## 2016-07-09 DIAGNOSIS — M25522 Pain in left elbow: Secondary | ICD-10-CM | POA: Diagnosis not present

## 2016-07-11 DIAGNOSIS — S46212A Strain of muscle, fascia and tendon of other parts of biceps, left arm, initial encounter: Secondary | ICD-10-CM | POA: Diagnosis not present

## 2016-07-22 DIAGNOSIS — R05 Cough: Secondary | ICD-10-CM | POA: Diagnosis not present

## 2016-07-28 DIAGNOSIS — H04123 Dry eye syndrome of bilateral lacrimal glands: Secondary | ICD-10-CM | POA: Diagnosis not present

## 2016-07-28 DIAGNOSIS — H25013 Cortical age-related cataract, bilateral: Secondary | ICD-10-CM | POA: Diagnosis not present

## 2016-07-28 DIAGNOSIS — H2513 Age-related nuclear cataract, bilateral: Secondary | ICD-10-CM | POA: Diagnosis not present

## 2016-08-26 DIAGNOSIS — M66822 Spontaneous rupture of other tendons, left upper arm: Secondary | ICD-10-CM | POA: Diagnosis not present

## 2016-08-26 DIAGNOSIS — M7522 Bicipital tendinitis, left shoulder: Secondary | ICD-10-CM | POA: Diagnosis not present

## 2016-09-05 DIAGNOSIS — M25522 Pain in left elbow: Secondary | ICD-10-CM | POA: Diagnosis not present

## 2016-09-05 DIAGNOSIS — M7522 Bicipital tendinitis, left shoulder: Secondary | ICD-10-CM | POA: Diagnosis not present

## 2016-09-08 DIAGNOSIS — E291 Testicular hypofunction: Secondary | ICD-10-CM | POA: Diagnosis not present

## 2016-09-24 DIAGNOSIS — Z9889 Other specified postprocedural states: Secondary | ICD-10-CM | POA: Diagnosis not present

## 2016-10-15 DIAGNOSIS — M25522 Pain in left elbow: Secondary | ICD-10-CM | POA: Diagnosis not present

## 2016-10-21 DIAGNOSIS — M25522 Pain in left elbow: Secondary | ICD-10-CM | POA: Diagnosis not present

## 2016-10-24 DIAGNOSIS — M25522 Pain in left elbow: Secondary | ICD-10-CM | POA: Diagnosis not present

## 2016-10-27 DIAGNOSIS — M25522 Pain in left elbow: Secondary | ICD-10-CM | POA: Diagnosis not present

## 2016-10-29 DIAGNOSIS — M25522 Pain in left elbow: Secondary | ICD-10-CM | POA: Diagnosis not present

## 2016-11-03 DIAGNOSIS — M25522 Pain in left elbow: Secondary | ICD-10-CM | POA: Diagnosis not present

## 2016-11-05 DIAGNOSIS — M25522 Pain in left elbow: Secondary | ICD-10-CM | POA: Diagnosis not present

## 2016-11-10 DIAGNOSIS — M25522 Pain in left elbow: Secondary | ICD-10-CM | POA: Diagnosis not present

## 2016-11-12 DIAGNOSIS — M25522 Pain in left elbow: Secondary | ICD-10-CM | POA: Diagnosis not present

## 2016-11-17 DIAGNOSIS — M25522 Pain in left elbow: Secondary | ICD-10-CM | POA: Diagnosis not present

## 2016-11-19 DIAGNOSIS — M25522 Pain in left elbow: Secondary | ICD-10-CM | POA: Diagnosis not present

## 2016-12-24 DIAGNOSIS — M25522 Pain in left elbow: Secondary | ICD-10-CM | POA: Diagnosis not present

## 2016-12-29 DIAGNOSIS — M25522 Pain in left elbow: Secondary | ICD-10-CM | POA: Diagnosis not present

## 2017-01-01 DIAGNOSIS — M25522 Pain in left elbow: Secondary | ICD-10-CM | POA: Diagnosis not present

## 2017-01-12 DIAGNOSIS — M25522 Pain in left elbow: Secondary | ICD-10-CM | POA: Diagnosis not present

## 2017-01-14 DIAGNOSIS — M25522 Pain in left elbow: Secondary | ICD-10-CM | POA: Diagnosis not present

## 2017-01-19 DIAGNOSIS — M25552 Pain in left hip: Secondary | ICD-10-CM | POA: Diagnosis not present

## 2017-01-21 DIAGNOSIS — M25522 Pain in left elbow: Secondary | ICD-10-CM | POA: Diagnosis not present

## 2017-01-26 DIAGNOSIS — M25522 Pain in left elbow: Secondary | ICD-10-CM | POA: Diagnosis not present

## 2017-05-25 DIAGNOSIS — Z113 Encounter for screening for infections with a predominantly sexual mode of transmission: Secondary | ICD-10-CM | POA: Diagnosis not present

## 2017-05-25 DIAGNOSIS — Z0000 Encounter for general adult medical examination without abnormal findings: Secondary | ICD-10-CM | POA: Diagnosis not present

## 2017-05-25 DIAGNOSIS — Z125 Encounter for screening for malignant neoplasm of prostate: Secondary | ICD-10-CM | POA: Diagnosis not present

## 2017-05-27 DIAGNOSIS — Z6830 Body mass index (BMI) 30.0-30.9, adult: Secondary | ICD-10-CM | POA: Diagnosis not present

## 2017-05-27 DIAGNOSIS — Z0001 Encounter for general adult medical examination with abnormal findings: Secondary | ICD-10-CM | POA: Diagnosis not present

## 2017-05-27 DIAGNOSIS — E7800 Pure hypercholesterolemia, unspecified: Secondary | ICD-10-CM | POA: Diagnosis not present

## 2017-07-29 DIAGNOSIS — H25013 Cortical age-related cataract, bilateral: Secondary | ICD-10-CM | POA: Diagnosis not present

## 2017-07-29 DIAGNOSIS — H2513 Age-related nuclear cataract, bilateral: Secondary | ICD-10-CM | POA: Diagnosis not present

## 2017-07-29 DIAGNOSIS — H04123 Dry eye syndrome of bilateral lacrimal glands: Secondary | ICD-10-CM | POA: Diagnosis not present

## 2017-08-07 DIAGNOSIS — J069 Acute upper respiratory infection, unspecified: Secondary | ICD-10-CM | POA: Diagnosis not present

## 2017-09-14 DIAGNOSIS — E7800 Pure hypercholesterolemia, unspecified: Secondary | ICD-10-CM | POA: Diagnosis not present

## 2017-09-16 DIAGNOSIS — E7800 Pure hypercholesterolemia, unspecified: Secondary | ICD-10-CM | POA: Diagnosis not present

## 2017-09-16 DIAGNOSIS — G4726 Circadian rhythm sleep disorder, shift work type: Secondary | ICD-10-CM | POA: Diagnosis not present

## 2017-09-16 DIAGNOSIS — Z6830 Body mass index (BMI) 30.0-30.9, adult: Secondary | ICD-10-CM | POA: Diagnosis not present

## 2017-10-28 IMAGING — MR MR ELBOW*L* W/O CM
4 of 6 series · 23 of 40 positions shown · non-contrast
Comparison: None.

CLINICAL DATA: Lateral elbow pain, weakness, and tenderness.
Assessment for biceps tendon injury.

EXAM:
MRI OF THE LEFT ELBOW WITHOUT CONTRAST
TECHNIQUE: Multiplanar, multisequence MR imaging of the elbow was performed. No
intravenous contrast was administered.

[Series 7: T2 fat-sat · axial · 3.5mm · 0.23mm/px · z∈[+2,+73]mm · 6 of 24 slices shown (1 of 2)]
[im 1/24]
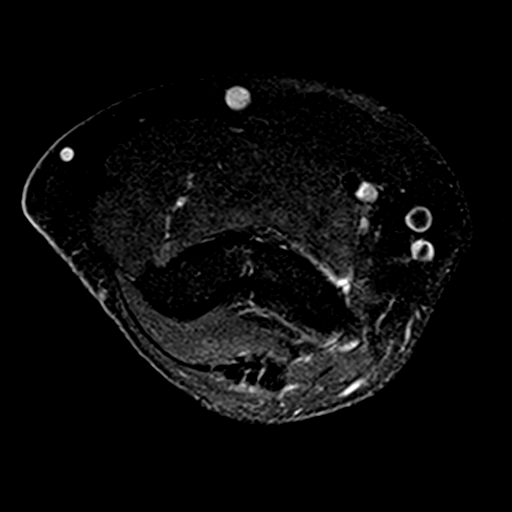
[im 4/24]
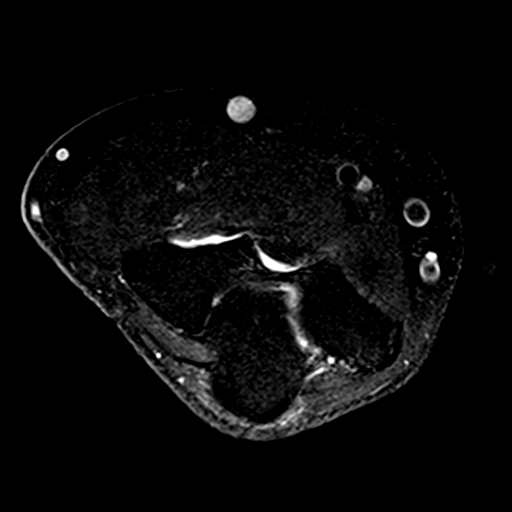
[im 8/24]
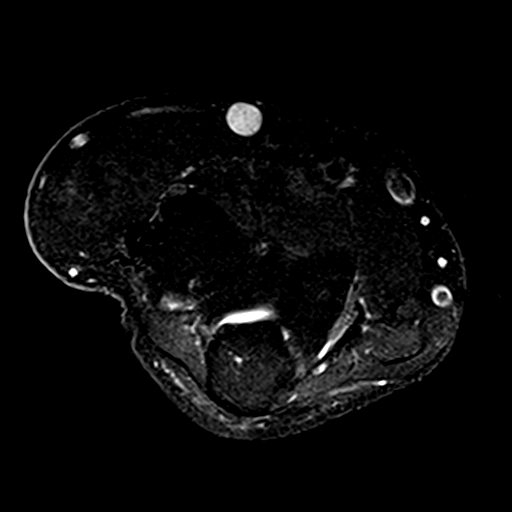
[im 12/24]
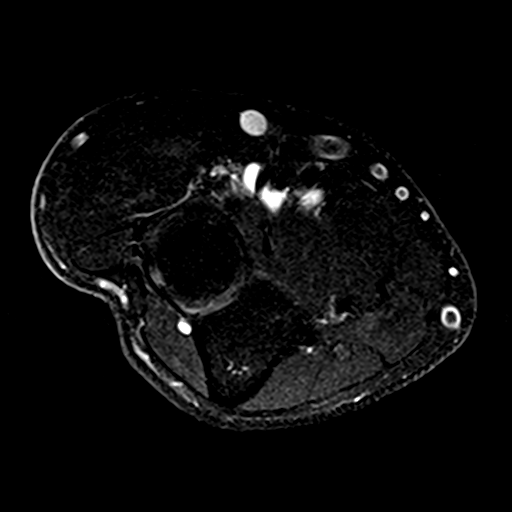
[im 16/24]
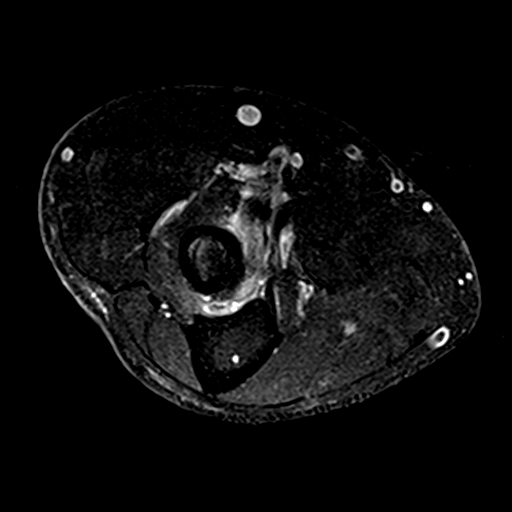
[im 20/24]
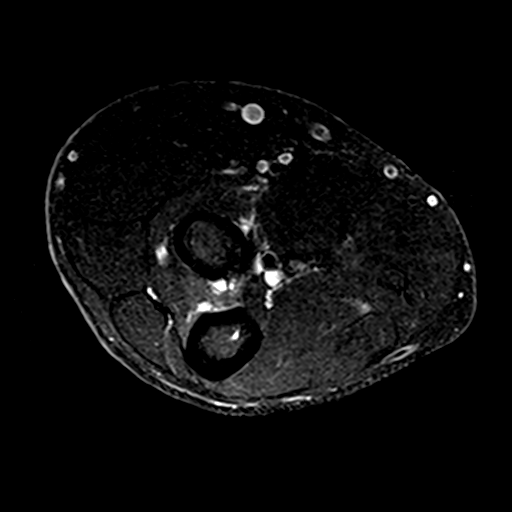

[Series 8: T2 fat-sat · coronal · 3.0mm · 0.27mm/px · 3 of 20 slices shown (2 of 2)]
[im 4/20]
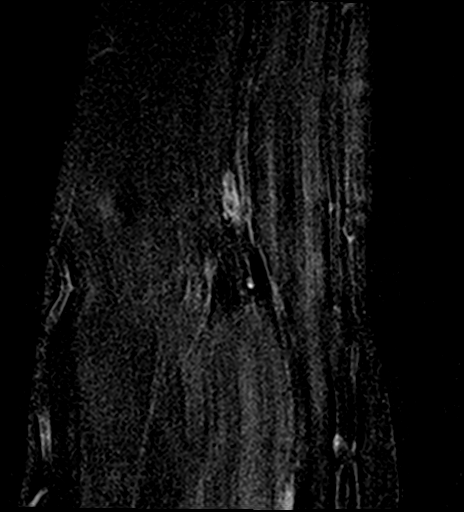
[im 12/20]
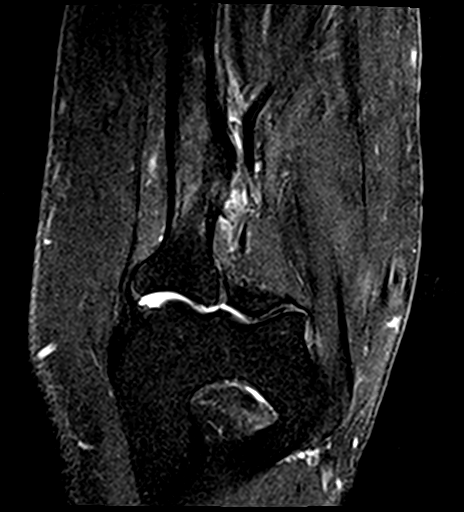
[im 20/20]
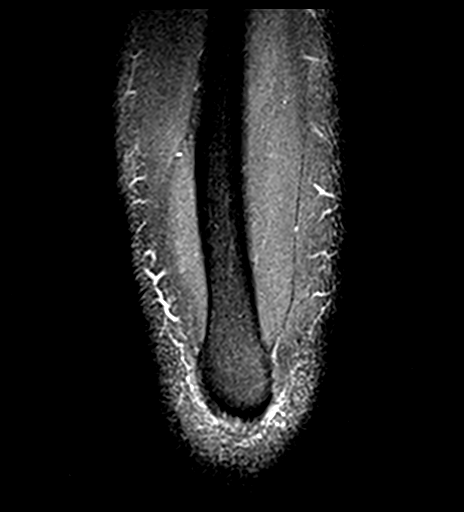

[Series 10: PD fat-sat · sagittal · 3.2mm · 0.55mm/px · 7 of 23 slices shown (1 of 2)]
[im 1/23]
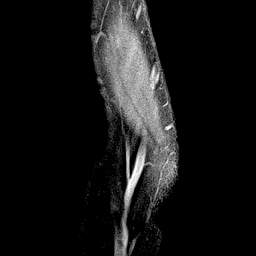
[im 4/23]
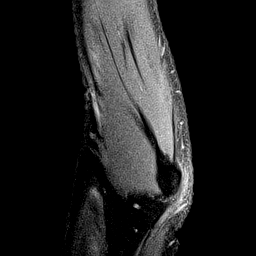
[im 8/23]
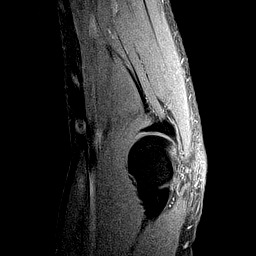
[im 12/23]
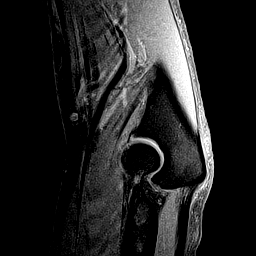
[im 15/23]
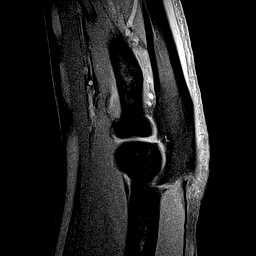
[im 19/23]
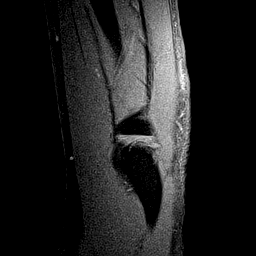
[im 23/23]
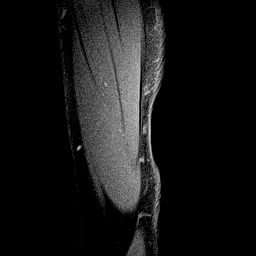

[Series 15: PD fat-sat · sagittal · 3.0mm · 0.19mm/px · 7 of 23 slices shown (2 of 2)]
[im 1/23]
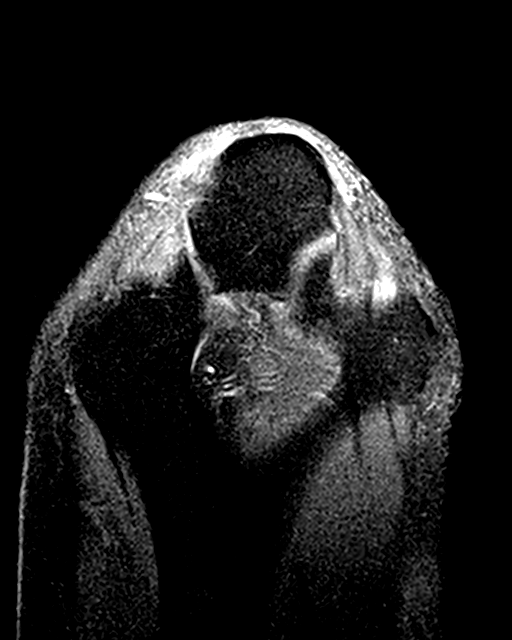
[im 4/23]
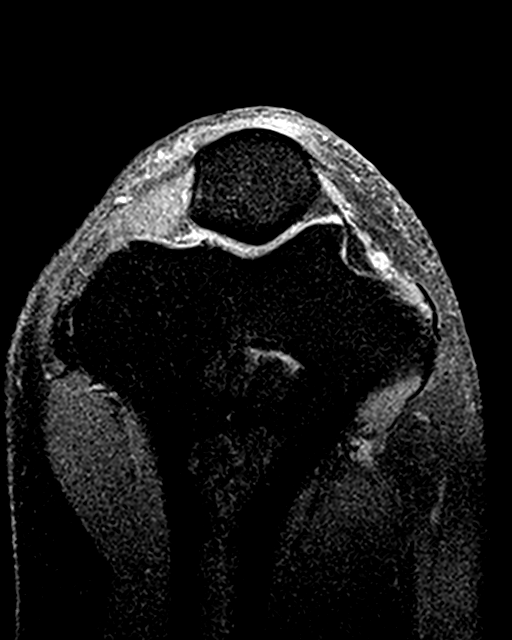
[im 8/23]
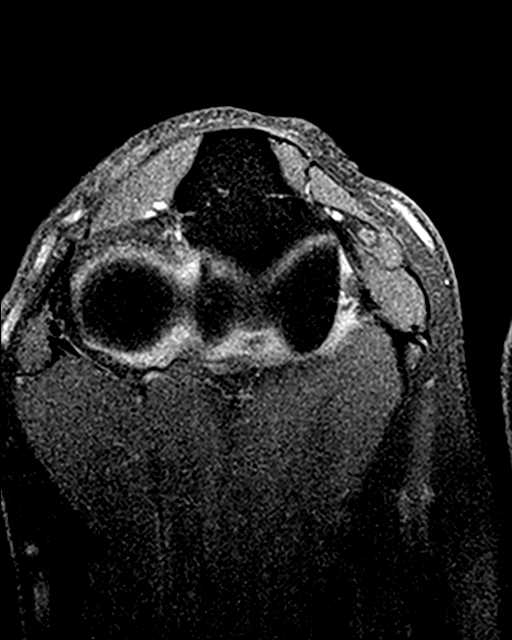
[im 12/23]
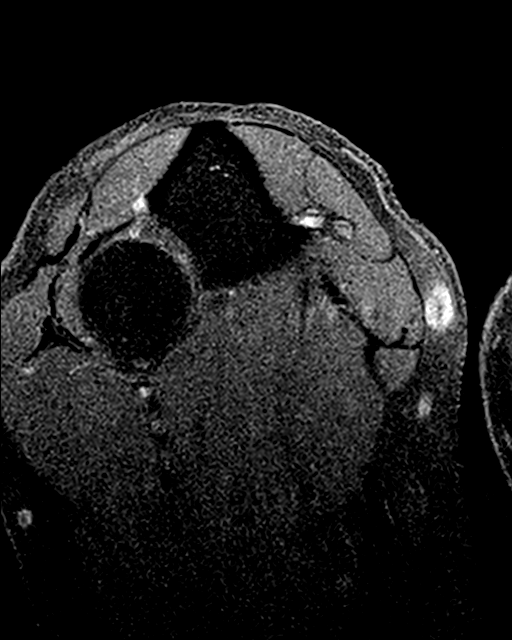
[im 15/23]
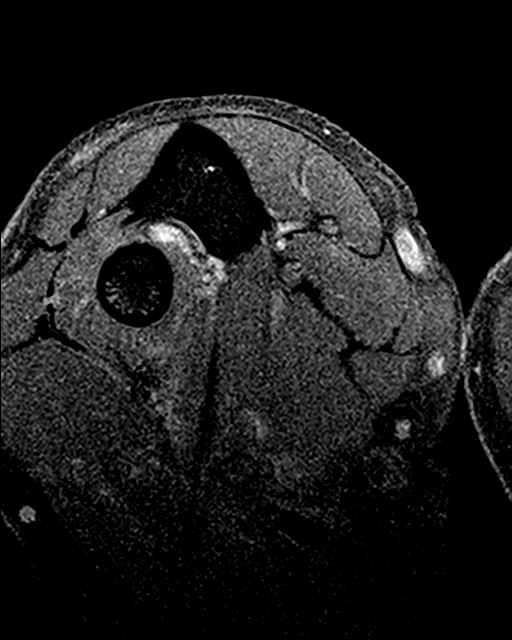
[im 19/23]
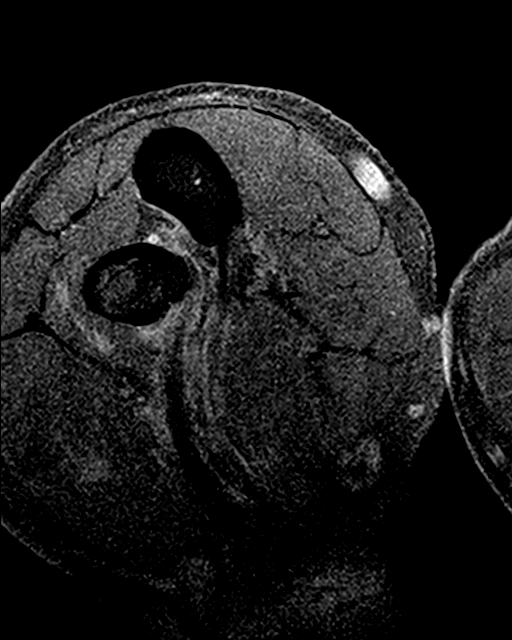
[im 23/23]
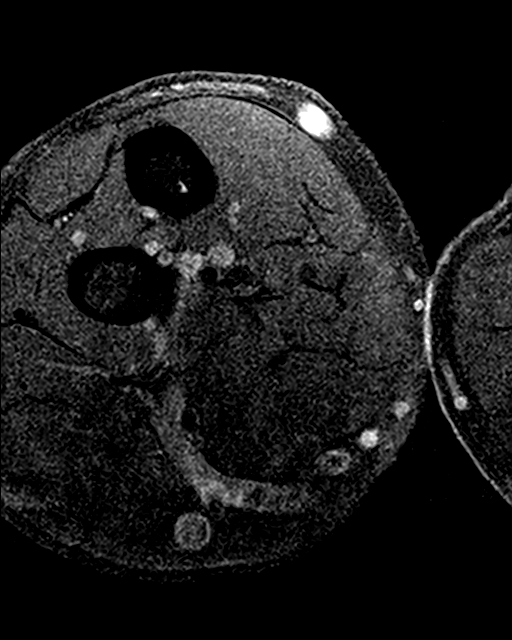

[23 of 40 positions shown; findings below may reference images not displayed]

FINDINGS: TENDONS

Common forearm flexor origin: Unremarkable

Common forearm extensor origin: Unremarkable

Biceps: Distal biceps tenosynovitis and tendinopathy, images 14
through 6 of series 7, without detachment. Adjacent brachialis
tendon intact.

Triceps: Unremarkable

LIGAMENTS

Medial stabilizers: The ulnar collateral ligament attaches just
distal to the sublime tubercle, images 7-8 series 8.

Lateral stabilizers:  Unremarkable

Cartilage: Unremarkable

Joint: Unremarkable

Cubital tunnel: Unremarkable

Bones: Unremarkable
IMPRESSION: 1. Distal biceps tenosynovitis and tendinopathy without overt tear
or detachment.
2. The ulnar collateral ligament attaches 3 mm distal to the sublime
tubercle. Whereas in the past this was thought to invariably
represent an undersurface tear, it is now recognize that this can be
a normal variant appearance in the absence of undersurface tear.

## 2018-01-15 DIAGNOSIS — E7800 Pure hypercholesterolemia, unspecified: Secondary | ICD-10-CM | POA: Diagnosis not present

## 2018-01-15 DIAGNOSIS — G4726 Circadian rhythm sleep disorder, shift work type: Secondary | ICD-10-CM | POA: Diagnosis not present

## 2018-01-15 DIAGNOSIS — M545 Low back pain: Secondary | ICD-10-CM | POA: Diagnosis not present

## 2018-05-26 DIAGNOSIS — Z0000 Encounter for general adult medical examination without abnormal findings: Secondary | ICD-10-CM | POA: Diagnosis not present

## 2018-05-26 DIAGNOSIS — Z125 Encounter for screening for malignant neoplasm of prostate: Secondary | ICD-10-CM | POA: Diagnosis not present

## 2018-06-01 DIAGNOSIS — R972 Elevated prostate specific antigen [PSA]: Secondary | ICD-10-CM | POA: Diagnosis not present

## 2018-06-01 DIAGNOSIS — M545 Low back pain: Secondary | ICD-10-CM | POA: Diagnosis not present

## 2018-06-01 DIAGNOSIS — Z0000 Encounter for general adult medical examination without abnormal findings: Secondary | ICD-10-CM | POA: Diagnosis not present

## 2018-06-01 DIAGNOSIS — M109 Gout, unspecified: Secondary | ICD-10-CM | POA: Diagnosis not present

## 2018-06-01 DIAGNOSIS — E041 Nontoxic single thyroid nodule: Secondary | ICD-10-CM | POA: Diagnosis not present

## 2018-06-01 DIAGNOSIS — E039 Hypothyroidism, unspecified: Secondary | ICD-10-CM | POA: Diagnosis not present

## 2018-06-03 DIAGNOSIS — E041 Nontoxic single thyroid nodule: Secondary | ICD-10-CM | POA: Diagnosis not present

## 2018-07-05 DIAGNOSIS — Z1212 Encounter for screening for malignant neoplasm of rectum: Secondary | ICD-10-CM | POA: Diagnosis not present

## 2018-07-05 DIAGNOSIS — R972 Elevated prostate specific antigen [PSA]: Secondary | ICD-10-CM | POA: Diagnosis not present

## 2018-07-05 DIAGNOSIS — Z1211 Encounter for screening for malignant neoplasm of colon: Secondary | ICD-10-CM | POA: Diagnosis not present

## 2018-07-05 DIAGNOSIS — M25561 Pain in right knee: Secondary | ICD-10-CM | POA: Diagnosis not present

## 2018-07-14 DIAGNOSIS — E039 Hypothyroidism, unspecified: Secondary | ICD-10-CM | POA: Diagnosis not present

## 2018-08-25 DIAGNOSIS — E039 Hypothyroidism, unspecified: Secondary | ICD-10-CM | POA: Diagnosis not present

## 2018-09-01 DIAGNOSIS — E7800 Pure hypercholesterolemia, unspecified: Secondary | ICD-10-CM | POA: Diagnosis not present

## 2018-09-01 DIAGNOSIS — R229 Localized swelling, mass and lump, unspecified: Secondary | ICD-10-CM | POA: Diagnosis not present

## 2018-09-01 DIAGNOSIS — E039 Hypothyroidism, unspecified: Secondary | ICD-10-CM | POA: Diagnosis not present

## 2018-09-01 DIAGNOSIS — E042 Nontoxic multinodular goiter: Secondary | ICD-10-CM | POA: Diagnosis not present

## 2019-04-18 ENCOUNTER — Other Ambulatory Visit: Payer: Self-pay | Admitting: Internal Medicine

## 2019-04-18 DIAGNOSIS — E042 Nontoxic multinodular goiter: Secondary | ICD-10-CM

## 2019-04-26 ENCOUNTER — Other Ambulatory Visit: Payer: BLUE CROSS/BLUE SHIELD

## 2019-06-01 DIAGNOSIS — Z0000 Encounter for general adult medical examination without abnormal findings: Secondary | ICD-10-CM | POA: Diagnosis not present

## 2019-06-06 DIAGNOSIS — G4726 Circadian rhythm sleep disorder, shift work type: Secondary | ICD-10-CM | POA: Diagnosis not present

## 2019-06-06 DIAGNOSIS — M109 Gout, unspecified: Secondary | ICD-10-CM | POA: Diagnosis not present

## 2019-06-06 DIAGNOSIS — Z1212 Encounter for screening for malignant neoplasm of rectum: Secondary | ICD-10-CM | POA: Diagnosis not present

## 2019-06-06 DIAGNOSIS — M545 Low back pain: Secondary | ICD-10-CM | POA: Diagnosis not present

## 2019-06-06 DIAGNOSIS — Z23 Encounter for immunization: Secondary | ICD-10-CM | POA: Diagnosis not present

## 2019-06-06 DIAGNOSIS — Z0001 Encounter for general adult medical examination with abnormal findings: Secondary | ICD-10-CM | POA: Diagnosis not present

## 2019-07-27 DIAGNOSIS — M79644 Pain in right finger(s): Secondary | ICD-10-CM | POA: Diagnosis not present

## 2019-07-27 DIAGNOSIS — R2231 Localized swelling, mass and lump, right upper limb: Secondary | ICD-10-CM | POA: Diagnosis not present

## 2019-09-19 DIAGNOSIS — M79645 Pain in left finger(s): Secondary | ICD-10-CM | POA: Diagnosis not present

## 2019-09-19 DIAGNOSIS — M79642 Pain in left hand: Secondary | ICD-10-CM | POA: Diagnosis not present

## 2019-12-07 DIAGNOSIS — Z1283 Encounter for screening for malignant neoplasm of skin: Secondary | ICD-10-CM | POA: Diagnosis not present

## 2019-12-07 DIAGNOSIS — D2371 Other benign neoplasm of skin of right lower limb, including hip: Secondary | ICD-10-CM | POA: Diagnosis not present

## 2019-12-07 DIAGNOSIS — D225 Melanocytic nevi of trunk: Secondary | ICD-10-CM | POA: Diagnosis not present

## 2019-12-07 DIAGNOSIS — B078 Other viral warts: Secondary | ICD-10-CM | POA: Diagnosis not present

## 2019-12-07 DIAGNOSIS — L255 Unspecified contact dermatitis due to plants, except food: Secondary | ICD-10-CM | POA: Diagnosis not present

## 2019-12-07 DIAGNOSIS — L280 Lichen simplex chronicus: Secondary | ICD-10-CM | POA: Diagnosis not present

## 2019-12-07 DIAGNOSIS — D485 Neoplasm of uncertain behavior of skin: Secondary | ICD-10-CM | POA: Diagnosis not present

## 2020-04-30 DIAGNOSIS — E039 Hypothyroidism, unspecified: Secondary | ICD-10-CM | POA: Diagnosis not present

## 2020-04-30 DIAGNOSIS — Z125 Encounter for screening for malignant neoplasm of prostate: Secondary | ICD-10-CM | POA: Diagnosis not present

## 2020-04-30 DIAGNOSIS — Z0000 Encounter for general adult medical examination without abnormal findings: Secondary | ICD-10-CM | POA: Diagnosis not present

## 2020-05-03 DIAGNOSIS — E7800 Pure hypercholesterolemia, unspecified: Secondary | ICD-10-CM | POA: Diagnosis not present

## 2020-05-03 DIAGNOSIS — R252 Cramp and spasm: Secondary | ICD-10-CM | POA: Diagnosis not present

## 2020-05-03 DIAGNOSIS — M25511 Pain in right shoulder: Secondary | ICD-10-CM | POA: Diagnosis not present

## 2020-05-03 DIAGNOSIS — M1990 Unspecified osteoarthritis, unspecified site: Secondary | ICD-10-CM | POA: Diagnosis not present

## 2020-05-03 DIAGNOSIS — Z0001 Encounter for general adult medical examination with abnormal findings: Secondary | ICD-10-CM | POA: Diagnosis not present

## 2020-05-03 DIAGNOSIS — K219 Gastro-esophageal reflux disease without esophagitis: Secondary | ICD-10-CM | POA: Diagnosis not present

## 2020-05-03 DIAGNOSIS — M79671 Pain in right foot: Secondary | ICD-10-CM | POA: Diagnosis not present

## 2020-05-08 DIAGNOSIS — M1990 Unspecified osteoarthritis, unspecified site: Secondary | ICD-10-CM | POA: Diagnosis not present

## 2020-05-08 DIAGNOSIS — M109 Gout, unspecified: Secondary | ICD-10-CM | POA: Diagnosis not present

## 2020-05-08 DIAGNOSIS — E781 Pure hyperglyceridemia: Secondary | ICD-10-CM | POA: Diagnosis not present

## 2020-05-08 DIAGNOSIS — R252 Cramp and spasm: Secondary | ICD-10-CM | POA: Diagnosis not present

## 2020-05-09 DIAGNOSIS — M25511 Pain in right shoulder: Secondary | ICD-10-CM | POA: Diagnosis not present

## 2020-05-09 DIAGNOSIS — M7989 Other specified soft tissue disorders: Secondary | ICD-10-CM | POA: Diagnosis not present

## 2020-05-09 DIAGNOSIS — M79673 Pain in unspecified foot: Secondary | ICD-10-CM | POA: Diagnosis not present

## 2020-05-09 DIAGNOSIS — M109 Gout, unspecified: Secondary | ICD-10-CM | POA: Diagnosis not present

## 2020-06-11 DIAGNOSIS — M25511 Pain in right shoulder: Secondary | ICD-10-CM | POA: Diagnosis not present

## 2020-06-13 DIAGNOSIS — M67911 Unspecified disorder of synovium and tendon, right shoulder: Secondary | ICD-10-CM | POA: Diagnosis not present

## 2020-07-11 DIAGNOSIS — M25511 Pain in right shoulder: Secondary | ICD-10-CM | POA: Diagnosis not present

## 2020-07-11 DIAGNOSIS — M109 Gout, unspecified: Secondary | ICD-10-CM | POA: Diagnosis not present

## 2020-07-11 DIAGNOSIS — M7989 Other specified soft tissue disorders: Secondary | ICD-10-CM | POA: Diagnosis not present

## 2020-07-11 DIAGNOSIS — M79673 Pain in unspecified foot: Secondary | ICD-10-CM | POA: Diagnosis not present

## 2020-07-13 DIAGNOSIS — M25511 Pain in right shoulder: Secondary | ICD-10-CM | POA: Diagnosis not present

## 2020-11-08 DIAGNOSIS — M25511 Pain in right shoulder: Secondary | ICD-10-CM | POA: Diagnosis not present

## 2020-11-08 DIAGNOSIS — M109 Gout, unspecified: Secondary | ICD-10-CM | POA: Diagnosis not present

## 2020-11-08 DIAGNOSIS — M7989 Other specified soft tissue disorders: Secondary | ICD-10-CM | POA: Diagnosis not present

## 2020-11-08 DIAGNOSIS — M79673 Pain in unspecified foot: Secondary | ICD-10-CM | POA: Diagnosis not present

## 2021-03-12 DIAGNOSIS — M1990 Unspecified osteoarthritis, unspecified site: Secondary | ICD-10-CM | POA: Diagnosis not present

## 2021-03-12 DIAGNOSIS — M109 Gout, unspecified: Secondary | ICD-10-CM | POA: Diagnosis not present

## 2021-03-12 DIAGNOSIS — M79673 Pain in unspecified foot: Secondary | ICD-10-CM | POA: Diagnosis not present

## 2021-04-30 DIAGNOSIS — Z0000 Encounter for general adult medical examination without abnormal findings: Secondary | ICD-10-CM | POA: Diagnosis not present

## 2021-04-30 DIAGNOSIS — E039 Hypothyroidism, unspecified: Secondary | ICD-10-CM | POA: Diagnosis not present

## 2021-04-30 DIAGNOSIS — Z125 Encounter for screening for malignant neoplasm of prostate: Secondary | ICD-10-CM | POA: Diagnosis not present

## 2021-07-25 ENCOUNTER — Other Ambulatory Visit: Payer: Self-pay | Admitting: Internal Medicine

## 2021-07-25 DIAGNOSIS — J309 Allergic rhinitis, unspecified: Secondary | ICD-10-CM | POA: Diagnosis not present

## 2021-07-25 DIAGNOSIS — K219 Gastro-esophageal reflux disease without esophagitis: Secondary | ICD-10-CM | POA: Diagnosis not present

## 2021-07-25 DIAGNOSIS — E782 Mixed hyperlipidemia: Secondary | ICD-10-CM

## 2021-07-25 DIAGNOSIS — G4726 Circadian rhythm sleep disorder, shift work type: Secondary | ICD-10-CM | POA: Diagnosis not present

## 2021-07-25 DIAGNOSIS — M109 Gout, unspecified: Secondary | ICD-10-CM | POA: Diagnosis not present

## 2021-07-25 DIAGNOSIS — Z0000 Encounter for general adult medical examination without abnormal findings: Secondary | ICD-10-CM | POA: Diagnosis not present

## 2021-07-25 DIAGNOSIS — M5459 Other low back pain: Secondary | ICD-10-CM | POA: Diagnosis not present

## 2021-07-25 DIAGNOSIS — E039 Hypothyroidism, unspecified: Secondary | ICD-10-CM | POA: Diagnosis not present

## 2021-08-07 DIAGNOSIS — Z1212 Encounter for screening for malignant neoplasm of rectum: Secondary | ICD-10-CM | POA: Diagnosis not present

## 2021-08-07 DIAGNOSIS — Z1211 Encounter for screening for malignant neoplasm of colon: Secondary | ICD-10-CM | POA: Diagnosis not present

## 2021-08-14 LAB — EXTERNAL GENERIC LAB PROCEDURE: COLOGUARD: NEGATIVE

## 2022-07-29 DIAGNOSIS — E039 Hypothyroidism, unspecified: Secondary | ICD-10-CM | POA: Diagnosis not present

## 2022-07-29 DIAGNOSIS — Z125 Encounter for screening for malignant neoplasm of prostate: Secondary | ICD-10-CM | POA: Diagnosis not present

## 2022-07-29 DIAGNOSIS — E7800 Pure hypercholesterolemia, unspecified: Secondary | ICD-10-CM | POA: Diagnosis not present

## 2022-07-31 DIAGNOSIS — G4726 Circadian rhythm sleep disorder, shift work type: Secondary | ICD-10-CM | POA: Diagnosis not present

## 2022-07-31 DIAGNOSIS — Z0000 Encounter for general adult medical examination without abnormal findings: Secondary | ICD-10-CM | POA: Diagnosis not present

## 2022-07-31 DIAGNOSIS — K219 Gastro-esophageal reflux disease without esophagitis: Secondary | ICD-10-CM | POA: Diagnosis not present

## 2022-07-31 DIAGNOSIS — M7061 Trochanteric bursitis, right hip: Secondary | ICD-10-CM | POA: Diagnosis not present

## 2022-07-31 DIAGNOSIS — E782 Mixed hyperlipidemia: Secondary | ICD-10-CM | POA: Diagnosis not present

## 2022-07-31 DIAGNOSIS — M109 Gout, unspecified: Secondary | ICD-10-CM | POA: Diagnosis not present

## 2022-07-31 DIAGNOSIS — Z23 Encounter for immunization: Secondary | ICD-10-CM | POA: Diagnosis not present

## 2022-07-31 DIAGNOSIS — M545 Low back pain, unspecified: Secondary | ICD-10-CM | POA: Diagnosis not present

## 2022-07-31 DIAGNOSIS — E039 Hypothyroidism, unspecified: Secondary | ICD-10-CM | POA: Diagnosis not present

## 2022-07-31 DIAGNOSIS — J309 Allergic rhinitis, unspecified: Secondary | ICD-10-CM | POA: Diagnosis not present

## 2022-08-06 NOTE — Progress Notes (Unsigned)
   I, Peterson Lombard, LAT, ATC acting as a scribe for Lynne Leader, MD.  Subjective:    CC: R hip and shoulder pain  HPI: Pt is a 67 y/o male c/o R hip pain x /. Pt locates pain to ?  Radiates:  LE Numbness/tingling: LE Weakness: Aggravates: Treatments tried:  Pt also c/o shoulder pain x /. Pt locates pain to   Radiates:  UE Numbness/tingling: UE Weakness: Aggravates: Treatments tried:  Pertinent review of Systems: ***  Relevant historical information: ***   Objective:   There were no vitals filed for this visit. General: Well Developed, well nourished, and in no acute distress.   MSK: ***  Lab and Radiology Results No results found for this or any previous visit (from the past 72 hour(s)). No results found.    Impression and Recommendations:    Assessment and Plan: 67 y.o. male with ***.  PDMP not reviewed this encounter. No orders of the defined types were placed in this encounter.  No orders of the defined types were placed in this encounter.   Discussed warning signs or symptoms. Please see discharge instructions. Patient expresses understanding.   ***

## 2022-08-07 ENCOUNTER — Ambulatory Visit (INDEPENDENT_AMBULATORY_CARE_PROVIDER_SITE_OTHER): Payer: Medicare HMO

## 2022-08-07 ENCOUNTER — Ambulatory Visit: Payer: Self-pay

## 2022-08-07 ENCOUNTER — Ambulatory Visit: Payer: Medicare HMO | Admitting: Family Medicine

## 2022-08-07 VITALS — BP 172/108 | HR 76 | Ht 69.0 in | Wt 191.0 lb

## 2022-08-07 DIAGNOSIS — M25551 Pain in right hip: Secondary | ICD-10-CM

## 2022-08-07 DIAGNOSIS — M25552 Pain in left hip: Secondary | ICD-10-CM | POA: Diagnosis not present

## 2022-08-07 DIAGNOSIS — M25511 Pain in right shoulder: Secondary | ICD-10-CM

## 2022-08-07 DIAGNOSIS — G8929 Other chronic pain: Secondary | ICD-10-CM | POA: Diagnosis not present

## 2022-08-07 NOTE — Patient Instructions (Addendum)
Thank you for coming in today.   Please get an Xray today before you leave   I've referred you to Physical Therapy.  Let us know if you don't hear from them in one week.   Recheck when you get back.   Let me know if you have a problem while you are away.

## 2022-08-11 NOTE — Progress Notes (Signed)
Right shoulder x-ray shows some mild arthritis changes.

## 2022-08-11 NOTE — Progress Notes (Signed)
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

## 2022-10-06 ENCOUNTER — Ambulatory Visit: Payer: Medicare HMO | Admitting: Family Medicine

## 2023-01-29 DIAGNOSIS — G4726 Circadian rhythm sleep disorder, shift work type: Secondary | ICD-10-CM | POA: Diagnosis not present

## 2023-01-29 DIAGNOSIS — M545 Low back pain, unspecified: Secondary | ICD-10-CM | POA: Diagnosis not present

## 2023-01-29 DIAGNOSIS — G8929 Other chronic pain: Secondary | ICD-10-CM | POA: Diagnosis not present

## 2023-07-30 DIAGNOSIS — E7800 Pure hypercholesterolemia, unspecified: Secondary | ICD-10-CM | POA: Diagnosis not present

## 2023-07-30 DIAGNOSIS — E039 Hypothyroidism, unspecified: Secondary | ICD-10-CM | POA: Diagnosis not present

## 2023-07-30 DIAGNOSIS — Z125 Encounter for screening for malignant neoplasm of prostate: Secondary | ICD-10-CM | POA: Diagnosis not present

## 2023-08-04 ENCOUNTER — Other Ambulatory Visit: Payer: Self-pay | Admitting: Internal Medicine

## 2023-08-04 ENCOUNTER — Other Ambulatory Visit (HOSPITAL_BASED_OUTPATIENT_CLINIC_OR_DEPARTMENT_OTHER): Payer: Self-pay | Admitting: Internal Medicine

## 2023-08-04 DIAGNOSIS — M545 Low back pain, unspecified: Secondary | ICD-10-CM | POA: Diagnosis not present

## 2023-08-04 DIAGNOSIS — Z0000 Encounter for general adult medical examination without abnormal findings: Secondary | ICD-10-CM | POA: Diagnosis not present

## 2023-08-04 DIAGNOSIS — K219 Gastro-esophageal reflux disease without esophagitis: Secondary | ICD-10-CM | POA: Diagnosis not present

## 2023-08-04 DIAGNOSIS — E782 Mixed hyperlipidemia: Secondary | ICD-10-CM

## 2023-08-04 DIAGNOSIS — J309 Allergic rhinitis, unspecified: Secondary | ICD-10-CM | POA: Diagnosis not present

## 2023-08-04 DIAGNOSIS — G4762 Sleep related leg cramps: Secondary | ICD-10-CM | POA: Diagnosis not present

## 2023-08-04 DIAGNOSIS — G4726 Circadian rhythm sleep disorder, shift work type: Secondary | ICD-10-CM | POA: Diagnosis not present

## 2023-08-04 DIAGNOSIS — Z23 Encounter for immunization: Secondary | ICD-10-CM | POA: Diagnosis not present

## 2023-08-04 DIAGNOSIS — E041 Nontoxic single thyroid nodule: Secondary | ICD-10-CM | POA: Diagnosis not present

## 2023-08-04 DIAGNOSIS — E039 Hypothyroidism, unspecified: Secondary | ICD-10-CM | POA: Diagnosis not present

## 2023-08-04 DIAGNOSIS — M109 Gout, unspecified: Secondary | ICD-10-CM | POA: Diagnosis not present

## 2023-08-05 ENCOUNTER — Ambulatory Visit
Admission: RE | Admit: 2023-08-05 | Discharge: 2023-08-05 | Discharge status: Home / Self Care | Payer: Medicare HMO | Source: Ambulatory Visit | Attending: Internal Medicine | Admitting: Internal Medicine

## 2023-08-05 DIAGNOSIS — E041 Nontoxic single thyroid nodule: Secondary | ICD-10-CM

## 2023-08-13 ENCOUNTER — Ambulatory Visit (HOSPITAL_COMMUNITY)
Admission: RE | Admit: 2023-08-13 | Discharge: 2023-08-13 | Disposition: A | Discharge status: Home / Self Care | Payer: Self-pay | Source: Ambulatory Visit | Attending: Internal Medicine | Admitting: Internal Medicine

## 2023-08-13 DIAGNOSIS — E782 Mixed hyperlipidemia: Secondary | ICD-10-CM | POA: Insufficient documentation

## 2023-11-02 DIAGNOSIS — E782 Mixed hyperlipidemia: Secondary | ICD-10-CM | POA: Diagnosis not present

## 2023-11-02 DIAGNOSIS — I2510 Atherosclerotic heart disease of native coronary artery without angina pectoris: Secondary | ICD-10-CM | POA: Diagnosis not present

## 2023-11-02 DIAGNOSIS — E042 Nontoxic multinodular goiter: Secondary | ICD-10-CM | POA: Diagnosis not present
